# Patient Record
Sex: Male | Born: 1995 | Race: White | Hispanic: No | Marital: Single | Smoking: Never smoker
Health system: Southern US, Community
[De-identification: ages and names within clinical notes are randomized; demographics above are authoritative.]

## PROBLEM LIST (undated history)

## (undated) DIAGNOSIS — N289 Disorder of kidney and ureter, unspecified: Secondary | ICD-10-CM

---
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
## 2019-08-30 ENCOUNTER — Emergency Department (HOSPITAL_COMMUNITY)
Admission: EM | Admit: 2019-08-30 | Discharge: 2019-08-30 | Disposition: A | Discharge status: Home / Self Care | Payer: BC Managed Care – PPO | Attending: Emergency Medicine | Admitting: Emergency Medicine

## 2019-08-30 ENCOUNTER — Other Ambulatory Visit: Payer: Self-pay

## 2019-08-30 ENCOUNTER — Encounter (HOSPITAL_COMMUNITY): Payer: Self-pay

## 2019-08-30 DIAGNOSIS — R102 Pelvic and perineal pain: Secondary | ICD-10-CM | POA: Diagnosis present

## 2019-08-30 DIAGNOSIS — N390 Urinary tract infection, site not specified: Secondary | ICD-10-CM | POA: Diagnosis not present

## 2019-08-30 HISTORY — DX: Disorder of kidney and ureter, unspecified: N28.9

## 2019-08-30 LAB — URINALYSIS, ROUTINE W REFLEX MICROSCOPIC
Glucose, UA: 250 mg/dL — AB
Hgb urine dipstick: NEGATIVE
Nitrite: POSITIVE — AB
Protein, ur: 100 mg/dL — AB
Specific Gravity, Urine: 1.01 (ref 1.005–1.030)
pH: 6.5 (ref 5.0–8.0)

## 2019-08-30 LAB — URINALYSIS, MICROSCOPIC (REFLEX): Bacteria, UA: NONE SEEN

## 2019-08-30 MED ORDER — SULFAMETHOXAZOLE-TRIMETHOPRIM 800-160 MG PO TABS: 1.0000 | ORAL_TABLET | Freq: Two times a day (BID) | ORAL | 0 refills | Status: AC

## 2019-08-30 MED ORDER — SULFAMETHOXAZOLE-TRIMETHOPRIM 800-160 MG PO TABS
1.0000 | ORAL_TABLET | Freq: Once | ORAL | Status: AC
  Administered 2019-08-30: 22:00:00 1 via ORAL
  Filled 2019-08-30: qty 1

## 2019-08-30 NOTE — ED Triage Notes (Signed)
Patient c/o right lower abdominal pain. Urinary frequency, and dysuria that began today. Patient states he has had the same symptoms a couple of months ago, but symptoms stopped.

## 2019-08-30 NOTE — ED Provider Notes (Signed)
[facility]-EMERGENCY DEPT Provider Note   CSN: [id number] Arrival date & time: 08/30/19  1717     History   Chief Complaint Chief Complaint  Patient presents with  . Pelvic Pain  . Dysuria  . Urinary Frequency    HPI Dustin Richmond is a 23 y.o. male.     Patient is a 23 year old male who presents with pain on urination. He states that he started having some urinary frequency and pain on urination earlier today. He has had some minor episodes over the last couple months intermittently but they were not very bad and resolved on their own so he never got it checked out. He said it started today and was worse than it typically was. He denies any penile discharge. He is sexually active but no history of STDs. He denies any associate abdominal or back pain. No testicular pain. No fevers. No nausea or vomiting.     Past Medical History:  Diagnosis Date  . Renal disorder     There are no active problems to display for this patient.   Past Surgical History:  Procedure Laterality Date  . TIBIA FRACTURE SURGERY Left         Home Medications    Prior to Admission medications   Medication Sig Start Date End Date Taking? Authorizing Provider  sulfamethoxazole-trimethoprim (BACTRIM DS) 800-160 MG tablet Take 1 tablet by mouth 2 (two) times daily for 7 days. 08/30/19 09/06/19  Rolan Bucco, MD    Family History Family History  Family history unknown: Yes    Social History Social History   Tobacco Use  . Smoking status: Never Smoker  . Smokeless tobacco: Never Used  Substance Use Topics  . Alcohol use: Never    Frequency: Never  . Drug use: Never     Allergies   Patient has no known allergies.   Review of Systems Review of Systems  Constitutional: Negative for chills, diaphoresis, fatigue and fever.  HENT: Negative for congestion, rhinorrhea and sneezing.   Eyes: Negative.   Respiratory: Negative for cough, chest tightness and  shortness of breath.   Cardiovascular: Negative for chest pain and leg swelling.  Gastrointestinal: Negative for abdominal pain, blood in stool, diarrhea, nausea and vomiting.  Genitourinary: Positive for dysuria and frequency. Negative for discharge, flank pain, hematuria, scrotal swelling and testicular pain.  Musculoskeletal: Negative for arthralgias and back pain.  Skin: Negative for rash.  Neurological: Negative for dizziness, speech difficulty, weakness, numbness and headaches.     Physical Exam Updated Vital Signs BP 129/85 (BP Location: Left Arm)   Pulse 82   Temp 98.1 F (36.7 C) (Oral)   Resp 16   Ht 6' (1.829 m)   Wt 72.6 kg   SpO2 100%   BMI 21.70 kg/m   Physical Exam Constitutional:      Appearance: He is well-developed.  HENT:     Head: Normocephalic and atraumatic.  Eyes:     Pupils: Pupils are equal, round, and reactive to light.  Neck:     Musculoskeletal: Normal range of motion and neck supple.  Cardiovascular:     Rate and Rhythm: Normal rate and regular rhythm.     Heart sounds: Normal heart sounds.  Pulmonary:     Effort: Pulmonary effort is normal. No respiratory distress.     Breath sounds: Normal breath sounds. No wheezing or rales.  Chest:     Chest wall: No tenderness.  Abdominal:     General: Bowel  sounds are normal.     Palpations: Abdomen is soft.     Tenderness: There is no abdominal tenderness. There is no guarding or rebound.  Genitourinary:    Comments: Normal external circumcised male genitalia. No discharge at the urethral meatus. No swelling of the scrotum. No testicular tenderness. No inguinal tenderness. Musculoskeletal: Normal range of motion.  Lymphadenopathy:     Cervical: No cervical adenopathy.  Skin:    General: Skin is warm and dry.     Findings: No rash.  Neurological:     Mental Status: He is alert and oriented to person, place, and time.      ED Treatments / Results  Labs (all labs ordered are listed, but only  abnormal results are displayed) Labs Reviewed  URINALYSIS, ROUTINE W REFLEX MICROSCOPIC - Abnormal; Notable for the following components:      Result Value   Color, Urine ORANGE (*)    Glucose, UA 250 (*)    Bilirubin Urine MODERATE (*)    Ketones, ur TRACE (*)    Protein, ur 100 (*)    Nitrite POSITIVE (*)    Leukocytes,Ua TRACE (*)    All other components within normal limits  URINE CULTURE  URINALYSIS, MICROSCOPIC (REFLEX)  GC/CHLAMYDIA PROBE AMP ([facility]) NOT AT [facility]    EKG None  Radiology No results found.  Procedures Procedures (including critical care time)  Medications Ordered in ED Medications  sulfamethoxazole-trimethoprim (BACTRIM DS) 800-160 MG per tablet 1 tablet (has no administration in time range)     Initial Impression / Assessment and Plan / ED Course  I have reviewed the triage vital signs and the nursing notes.  Pertinent labs & imaging results that were available during my care of the patient were reviewed by me and considered in my medical decision making (see chart for details).        Patient presents with dysuria and urinary frequency.  He does not have any penile discharge.  His urine has nitrates and leukocytes.  There is no bacteria present.  However given his symptoms, I will go ahead and treat him for possible UTI with Bactrim.  He does not have any systemic signs of illness.  No associated abdominal pain which would be more concerning for renal colic.  No hematuria.  He was also tested for gonorrhea and chlamydia which are pending.  Urine culture was sent.  He was given a referral to follow-up with urology if his symptoms are not improving.  Return precautions were given.  Final Clinical Impressions(s) / ED Diagnoses   Final diagnoses:  Lower urinary tract infectious disease    ED Discharge Orders         Ordered    sulfamethoxazole-trimethoprim (BACTRIM DS) 800-160 MG tablet  2 times daily     08/30/19 2142           Malvin Johns, MD 08/30/19 2143

## 2019-09-01 LAB — URINE CULTURE: Culture: NO GROWTH
